# Patient Record
Sex: Female | Born: 1960 | Race: White | Hispanic: No | Marital: Married
Health system: Southern US, Community
[De-identification: ages and names within clinical notes are randomized; demographics above are authoritative.]

## PROBLEM LIST (undated history)

## (undated) DIAGNOSIS — C801 Malignant (primary) neoplasm, unspecified: Secondary | ICD-10-CM

## (undated) DIAGNOSIS — B977 Papillomavirus as the cause of diseases classified elsewhere: Secondary | ICD-10-CM

## (undated) HISTORY — DX: Papillomavirus as the cause of diseases classified elsewhere: B97.7

---

## 2014-12-08 ENCOUNTER — Other Ambulatory Visit: Payer: Self-pay | Admitting: Physician Assistant

## 2014-12-08 DIAGNOSIS — M5126 Other intervertebral disc displacement, lumbar region: Secondary | ICD-10-CM | POA: Insufficient documentation

## 2014-12-08 DIAGNOSIS — Z1231 Encounter for screening mammogram for malignant neoplasm of breast: Secondary | ICD-10-CM

## 2014-12-24 ENCOUNTER — Ambulatory Visit
Admission: RE | Admit: 2014-12-24 | Discharge: 2014-12-24 | Disposition: A | Discharge status: Home / Self Care | Payer: Managed Care, Other (non HMO) | Source: Ambulatory Visit | Attending: Physician Assistant | Admitting: Physician Assistant

## 2014-12-24 DIAGNOSIS — Z1231 Encounter for screening mammogram for malignant neoplasm of breast: Secondary | ICD-10-CM | POA: Insufficient documentation

## 2014-12-24 HISTORY — DX: Malignant (primary) neoplasm, unspecified: C80.1

## 2015-03-28 DIAGNOSIS — J302 Other seasonal allergic rhinitis: Secondary | ICD-10-CM | POA: Insufficient documentation

## 2015-11-16 ENCOUNTER — Other Ambulatory Visit: Payer: Self-pay | Admitting: Physician Assistant

## 2015-12-15 ENCOUNTER — Other Ambulatory Visit: Payer: Self-pay | Admitting: Physician Assistant

## 2015-12-15 DIAGNOSIS — Z1231 Encounter for screening mammogram for malignant neoplasm of breast: Secondary | ICD-10-CM

## 2016-01-04 ENCOUNTER — Other Ambulatory Visit: Payer: Self-pay | Admitting: Physician Assistant

## 2016-01-04 ENCOUNTER — Ambulatory Visit
Admission: RE | Admit: 2016-01-04 | Discharge: 2016-01-04 | Disposition: A | Discharge status: Home / Self Care | Payer: Managed Care, Other (non HMO) | Source: Ambulatory Visit | Attending: Physician Assistant | Admitting: Physician Assistant

## 2016-01-04 DIAGNOSIS — Z1231 Encounter for screening mammogram for malignant neoplasm of breast: Secondary | ICD-10-CM | POA: Insufficient documentation

## 2016-11-22 ENCOUNTER — Other Ambulatory Visit: Payer: Self-pay | Admitting: Physician Assistant

## 2016-11-22 DIAGNOSIS — Z1231 Encounter for screening mammogram for malignant neoplasm of breast: Secondary | ICD-10-CM

## 2017-01-07 ENCOUNTER — Ambulatory Visit
Admission: RE | Admit: 2017-01-07 | Discharge: 2017-01-07 | Disposition: A | Discharge status: Home / Self Care | Payer: Managed Care, Other (non HMO) | Source: Ambulatory Visit | Attending: Physician Assistant | Admitting: Physician Assistant

## 2017-01-07 DIAGNOSIS — Z1231 Encounter for screening mammogram for malignant neoplasm of breast: Secondary | ICD-10-CM | POA: Insufficient documentation

## 2018-02-06 ENCOUNTER — Other Ambulatory Visit: Payer: Self-pay | Admitting: Physician Assistant

## 2018-02-06 DIAGNOSIS — Z1231 Encounter for screening mammogram for malignant neoplasm of breast: Secondary | ICD-10-CM

## 2018-02-21 ENCOUNTER — Encounter: Payer: Self-pay | Admitting: Radiology

## 2018-02-21 ENCOUNTER — Ambulatory Visit
Admission: RE | Admit: 2018-02-21 | Discharge: 2018-02-21 | Disposition: A | Discharge status: Home / Self Care | Payer: 59 | Source: Ambulatory Visit | Attending: Physician Assistant | Admitting: Physician Assistant

## 2018-02-21 DIAGNOSIS — Z1231 Encounter for screening mammogram for malignant neoplasm of breast: Secondary | ICD-10-CM | POA: Diagnosis present

## 2018-03-03 ENCOUNTER — Other Ambulatory Visit: Payer: Self-pay | Admitting: Physician Assistant

## 2018-03-03 DIAGNOSIS — R928 Other abnormal and inconclusive findings on diagnostic imaging of breast: Secondary | ICD-10-CM

## 2018-03-07 ENCOUNTER — Ambulatory Visit
Admission: RE | Admit: 2018-03-07 | Discharge: 2018-03-07 | Disposition: A | Discharge status: Home / Self Care | Payer: 59 | Source: Ambulatory Visit | Attending: Physician Assistant | Admitting: Physician Assistant

## 2018-03-07 ENCOUNTER — Emergency Department
Admission: EM | Admit: 2018-03-07 | Discharge: 2018-03-07 | Disposition: A | Discharge status: Home / Self Care | Payer: 59 | Attending: Emergency Medicine | Admitting: Emergency Medicine

## 2018-03-07 ENCOUNTER — Other Ambulatory Visit: Payer: Self-pay

## 2018-03-07 DIAGNOSIS — R928 Other abnormal and inconclusive findings on diagnostic imaging of breast: Secondary | ICD-10-CM | POA: Insufficient documentation

## 2018-03-07 DIAGNOSIS — R0602 Shortness of breath: Secondary | ICD-10-CM | POA: Diagnosis present

## 2018-03-07 DIAGNOSIS — T782XXA Anaphylactic shock, unspecified, initial encounter: Secondary | ICD-10-CM | POA: Insufficient documentation

## 2018-03-07 MED ORDER — PREDNISONE 50 MG PO TABS: 50.0000 mg | ORAL_TABLET | Freq: Every day | ORAL | 0 refills | Status: AC

## 2018-03-07 MED ORDER — PREDNISONE 20 MG PO TABS
60.0000 mg | ORAL_TABLET | Freq: Once | ORAL | Status: AC
  Administered 2018-03-07: 60 mg via ORAL
  Filled 2018-03-07: qty 3

## 2018-03-07 MED ORDER — CETIRIZINE HCL 10 MG PO TABS: 10.0000 mg | ORAL_TABLET | Freq: Four times a day (QID) | ORAL | 0 refills | Status: DC

## 2018-03-07 MED ORDER — EPINEPHRINE 0.3 MG/0.3ML IJ SOAJ: 0.3000 mg | Freq: Once | INTRAMUSCULAR | 0 refills | Status: AC

## 2018-03-07 MED ORDER — EPINEPHRINE 0.3 MG/0.3ML IJ SOAJ
0.3000 mg | Freq: Once | INTRAMUSCULAR | Status: AC
  Administered 2018-03-07: 0.3 mg via INTRAMUSCULAR
  Filled 2018-03-07: qty 0.3

## 2018-03-07 NOTE — Discharge Instructions (Signed)
Please stop taking moxifloxacin - the next time you have an allergic reaction it will likely be much worse.  Take all of your steroids as prescribed and return to the ED for any concerns.  It was a pleasure to take care of you today, and thank you for coming to our emergency department.  If you have any questions or concerns before leaving please ask the nurse to grab me and I'm more than happy to go through your aftercare instructions again.  If you were prescribed any opioid pain medication today such as Norco, Vicodin, Percocet, morphine, hydrocodone, or oxycodone please make sure you do not drive when you are taking this medication as it can alter your ability to drive safely.  If you have any concerns once you are home that you are not improving or are in fact getting worse before you can make it to your follow-up appointment, please do not hesitate to call 911 and come back for further evaluation.  Darel Hong, MD  No results found for this or any previous visit. Mm 3d Screen Breast Bilateral  Result Date: 02/25/2018 CLINICAL DATA:  Screening. EXAM: DIGITAL SCREENING BILATERAL MAMMOGRAM WITH TOMO AND CAD COMPARISON:  Previous exam(s). ACR Breast Density Category c: The breast tissue is heterogeneously dense, which may obscure small masses. FINDINGS: In the left breast, possible distortion warrants further evaluation. In the right breast, no findings suspicious for malignancy. Images were processed with CAD. IMPRESSION: Further evaluation is suggested for possible distortion in the left breast. RECOMMENDATION: Diagnostic mammogram and possibly ultrasound of the left breast. (Code:[id number]) The patient will be contacted regarding the findings, and additional imaging will be scheduled. BI-RADS CATEGORY  0: Incomplete. Need additional imaging evaluation and/or prior mammograms for comparison. Electronically Signed   By: Evangeline Dakin M.D.   On: 02/25/2018 12:43

## 2018-03-07 NOTE — ED Notes (Signed)
E-sign is not working.Pt states verbal understanding of discharge instructions.

## 2018-03-07 NOTE — ED Triage Notes (Addendum)
Pt was started yesterday on antibiotics for sinus infection, this am woke up with tingling all over, and tongue swelling. Pt took benadryl 25 mg about 0100.

## 2018-03-07 NOTE — ED Provider Notes (Signed)
[facility] Emergency Department Provider Note  ____________________________________________   First MD Initiated Contact with Patient 03/07/18 0116     (approximate)  I have reviewed the triage vital signs and the nursing notes.   HISTORY  Chief Complaint Allergic Reaction   HPI Natasha Richardson is a 57 y.o. female who comes to the emergency department with tongue swelling, "shortness of breath" and diffuse itching and tingling shortly after taking her second lifetime dose of  moxifloxacin for a sinus infection.  She took her first dose yesterday without issues and then about 30 minutes after taking her second dose today her symptoms began.  She does have known allergies to sulfa antibiotics.  She took 25 mg of Benadryl with minimal relief.  Symptoms came on suddenly have been progressive are now moderate severity and nothing seems to make them significantly better.   Past Medical History:  Diagnosis Date  . Cancer ([place])    skin    There are no active problems to display for this patient.   No past surgical history on file.  Prior to Admission medications   Medication Sig Start Date End Date Taking? Authorizing Provider  cetirizine (ZYRTEC ALLERGY) 10 MG tablet Take 1 tablet (10 mg total) by mouth 4 (four) times daily for 7 days. 03/07/18 03/14/18  Darel Hong, MD  predniSONE (DELTASONE) 50 MG tablet Take 1 tablet (50 mg total) by mouth daily for 4 days. 03/07/18 03/11/18  Darel Hong, MD    Allergies Sulfa antibiotics  Family History  Problem Relation Age of Onset  . Breast cancer Paternal Aunt 45    Social History Social History   Tobacco Use  . Smoking status: Not on file  Substance Use Topics  . Alcohol use: Not on file  . Drug use: Not on file    Review of Systems Constitutional: No fever/chills Eyes: No visual changes. ENT: Positive for difficulty swelling and tongue swelling Cardiovascular: Denies chest pain. Respiratory:  Positive for shortness of breath. Gastrointestinal: No abdominal pain.  No nausea, no vomiting.  No diarrhea.  No constipation. Genitourinary: Negative for dysuria. Musculoskeletal: Negative for back pain. Skin: Positive for itching and rash Neurological: Negative for headaches, focal weakness or numbness.   ____________________________________________   PHYSICAL EXAM:  VITAL SIGNS: ED Triage Vitals  Enc Vitals Group     BP 03/07/18 0113 128/79     Pulse Rate 03/07/18 0113 71     Resp 03/07/18 0113 20     Temp 03/07/18 0113 98.2 F (36.8 C)     Temp Source 03/07/18 0113 Oral     SpO2 03/07/18 0113 100 %     Weight 03/07/18 0114 180 lb (81.6 kg)     Height 03/07/18 0114 5\' 4"  (1.626 m)     Head Circumference --      Peak Flow --      Pain Score --      Pain Loc --      Pain Edu? --      Excl. in [place]? --     Constitutional: Alert and oriented x4 appears somewhat uncomfortable nontoxic no diaphoresis Eyes: PERRL EOMI. Head: Atraumatic. Nose: No congestion/rhinnorhea. Mouth/Throat: No trismus tongue is slightly swollen although protecting airway no drooling Neck: No stridor.   Cardiovascular: Normal rate, regular rhythm. Grossly normal heart sounds.  Good peripheral circulation. Respiratory: Normal respiratory effort.  No retractions. Lungs CTAB and moving good air Gastrointestinal: Soft nontender Musculoskeletal: No lower extremity edema   Neurologic:  Normal speech and language. No gross focal neurologic deficits are appreciated. Skin: Mild erythematous rash across upper chest Psychiatric: Mood and affect are normal. Speech and behavior are normal.    ____________________________________________   DIFFERENTIAL includes but not limited to  Allergic reaction, anaphylaxis, drug reaction ____________________________________________   LABS (all labs ordered are listed, but only abnormal results are displayed)  Labs Reviewed - No data to  display   __________________________________________  EKG   ____________________________________________  RADIOLOGY   ____________________________________________   PROCEDURES  Procedure(s) performed: no  .Critical Care Performed by: Darel Hong, MD Authorized by: Darel Hong, MD   Critical care provider statement:    Critical care time (minutes):  30   Critical care time was exclusive of:  Separately billable procedures and treating other patients   Critical care was necessary to treat or prevent imminent or life-threatening deterioration of the following conditions: Anaphylaxis.   Critical care was time spent personally by me on the following activities:  Development of treatment plan with patient or surrogate, discussions with consultants, evaluation of patient's response to treatment, examination of patient, obtaining history from patient or surrogate, ordering and performing treatments and interventions, ordering and review of laboratory studies, ordering and review of radiographic studies, pulse oximetry, re-evaluation of patient's condition and review of old charts    Critical Care performed: no  ____________________________________________   INITIAL IMPRESSION / [place] / ED COURSE  Pertinent labs & imaging results that were available during my care of the patient were reviewed by me and considered in my medical decision making (see chart for details).   As part of my medical decision making, I reviewed the following data within the [place] History obtained from family if available, nursing notes, old chart and ekg, as well as notes from prior ED visits.  The patient symptoms are consistent with allergic reaction and mild anaphylaxis.  We will give the patient IM epinephrine in addition to steroids and reevaluate.  Following intramuscular epinephrine the patient's symptoms rapidly improved leading me to believe this is  likely allergic reaction and mild anaphylaxis.  She was observed for hours in the emergency department with no rebound.  I will discharge her home with a short course of steroids as well as high-dose cetirizine and an EpiPen for home.  She understands not to take quinolones in the future.      ____________________________________________   FINAL CLINICAL IMPRESSION(S) / ED DIAGNOSES  Final diagnoses:  Anaphylaxis, initial encounter      NEW MEDICATIONS STARTED DURING THIS VISIT:  Discharge Medication List as of 03/07/2018  4:08 AM    START taking these medications   Details  cetirizine (ZYRTEC ALLERGY) 10 MG tablet Take 1 tablet (10 mg total) by mouth 4 (four) times daily for 7 days., Starting Fri 03/07/2018, Until Fri 03/14/2018, Print    EPINEPHrine (EPIPEN 2-PAK) 0.3 mg/0.3 mL IJ SOAJ injection Inject 0.3 mLs (0.3 mg total) into the muscle once for 1 dose., Starting Fri 03/07/2018, Print    predniSONE (DELTASONE) 50 MG tablet Take 1 tablet (50 mg total) by mouth daily for 4 days., Starting Fri 03/07/2018, Until Tue 03/11/2018, Print         Note:  This document was prepared using Dragon voice recognition software and may include unintentional dictation errors.     Darel Hong, MD 03/09/18 [phone]

## 2019-03-12 ENCOUNTER — Other Ambulatory Visit: Payer: Self-pay | Admitting: Physician Assistant

## 2019-03-12 DIAGNOSIS — Z1231 Encounter for screening mammogram for malignant neoplasm of breast: Secondary | ICD-10-CM

## 2019-04-20 ENCOUNTER — Ambulatory Visit
Admission: RE | Admit: 2019-04-20 | Discharge: 2019-04-20 | Disposition: A | Discharge status: Home / Self Care | Payer: BC Managed Care – PPO | Source: Ambulatory Visit | Attending: Physician Assistant | Admitting: Physician Assistant

## 2019-04-20 DIAGNOSIS — Z1231 Encounter for screening mammogram for malignant neoplasm of breast: Secondary | ICD-10-CM | POA: Insufficient documentation

## 2020-03-28 ENCOUNTER — Other Ambulatory Visit: Payer: Self-pay | Admitting: Physician Assistant

## 2020-03-28 DIAGNOSIS — Z1231 Encounter for screening mammogram for malignant neoplasm of breast: Secondary | ICD-10-CM

## 2020-05-31 ENCOUNTER — Ambulatory Visit
Admission: RE | Admit: 2020-05-31 | Discharge: 2020-05-31 | Disposition: A | Discharge status: Home / Self Care | Payer: No Typology Code available for payment source | Source: Ambulatory Visit | Attending: Physician Assistant | Admitting: Physician Assistant

## 2020-05-31 ENCOUNTER — Other Ambulatory Visit: Payer: Self-pay

## 2020-05-31 DIAGNOSIS — Z1231 Encounter for screening mammogram for malignant neoplasm of breast: Secondary | ICD-10-CM | POA: Insufficient documentation

## 2020-12-06 ENCOUNTER — Other Ambulatory Visit: Payer: Self-pay | Admitting: Family Medicine

## 2020-12-06 DIAGNOSIS — N644 Mastodynia: Secondary | ICD-10-CM

## 2020-12-08 ENCOUNTER — Other Ambulatory Visit: Payer: Self-pay | Admitting: Family Medicine

## 2020-12-08 DIAGNOSIS — N644 Mastodynia: Secondary | ICD-10-CM

## 2020-12-12 ENCOUNTER — Other Ambulatory Visit: Payer: No Typology Code available for payment source

## 2020-12-12 ENCOUNTER — Ambulatory Visit
Admission: RE | Admit: 2020-12-12 | Discharge: 2020-12-12 | Disposition: A | Discharge status: Home / Self Care | Payer: No Typology Code available for payment source | Source: Ambulatory Visit | Attending: Family Medicine | Admitting: Family Medicine

## 2020-12-12 ENCOUNTER — Other Ambulatory Visit: Payer: Self-pay

## 2020-12-12 DIAGNOSIS — N644 Mastodynia: Secondary | ICD-10-CM | POA: Insufficient documentation

## 2021-04-07 ENCOUNTER — Other Ambulatory Visit: Payer: Self-pay | Admitting: Physician Assistant

## 2021-04-07 DIAGNOSIS — Z1231 Encounter for screening mammogram for malignant neoplasm of breast: Secondary | ICD-10-CM

## 2021-06-01 ENCOUNTER — Ambulatory Visit
Admission: RE | Admit: 2021-06-01 | Discharge: 2021-06-01 | Disposition: A | Discharge status: Home / Self Care | Payer: No Typology Code available for payment source | Source: Ambulatory Visit | Attending: Physician Assistant | Admitting: Physician Assistant

## 2021-06-01 ENCOUNTER — Other Ambulatory Visit: Payer: Self-pay

## 2021-06-01 DIAGNOSIS — Z1231 Encounter for screening mammogram for malignant neoplasm of breast: Secondary | ICD-10-CM | POA: Diagnosis not present

## 2021-11-09 ENCOUNTER — Other Ambulatory Visit: Payer: Self-pay

## 2021-11-09 DIAGNOSIS — N390 Urinary tract infection, site not specified: Secondary | ICD-10-CM

## 2021-11-10 ENCOUNTER — Other Ambulatory Visit
Admission: RE | Admit: 2021-11-10 | Discharge: 2021-11-10 | Disposition: A | Discharge status: Home / Self Care | Payer: No Typology Code available for payment source | Attending: Urology | Admitting: Urology

## 2021-11-10 ENCOUNTER — Encounter: Payer: Self-pay | Admitting: Urology

## 2021-11-10 ENCOUNTER — Ambulatory Visit (INDEPENDENT_AMBULATORY_CARE_PROVIDER_SITE_OTHER): Payer: No Typology Code available for payment source | Admitting: Urology

## 2021-11-10 VITALS — BP 131/81 | HR 75 | Ht 64.0 in | Wt 185.0 lb

## 2021-11-10 DIAGNOSIS — R102 Pelvic and perineal pain: Secondary | ICD-10-CM

## 2021-11-10 DIAGNOSIS — R350 Frequency of micturition: Secondary | ICD-10-CM | POA: Diagnosis not present

## 2021-11-10 DIAGNOSIS — R3129 Other microscopic hematuria: Secondary | ICD-10-CM

## 2021-11-10 DIAGNOSIS — N390 Urinary tract infection, site not specified: Secondary | ICD-10-CM | POA: Diagnosis present

## 2021-11-10 DIAGNOSIS — N950 Postmenopausal bleeding: Secondary | ICD-10-CM | POA: Insufficient documentation

## 2021-11-10 LAB — URINALYSIS, COMPLETE (UACMP) WITH MICROSCOPIC
Bilirubin Urine: NEGATIVE
Glucose, UA: NEGATIVE mg/dL
Ketones, ur: NEGATIVE mg/dL
Leukocytes,Ua: NEGATIVE
Nitrite: NEGATIVE
Protein, ur: NEGATIVE mg/dL
Specific Gravity, Urine: 1.02 (ref 1.005–1.030)
pH: 5 (ref 5.0–8.0)

## 2021-11-10 NOTE — Patient Instructions (Addendum)
[email or web address]   Cystoscopy Cystoscopy is a procedure that is used to help diagnose and sometimes treat conditions that affect the lower urinary tract. The lower urinary tract includes the bladder and the urethra. The urethra is the tube that drains urine from the bladder. Cystoscopy is done using a thin, tube-shaped instrument with a light and camera at the end (cystoscope). The cystoscope may be hard or flexible, depending on the goal of the procedure. The cystoscope is inserted through the urethra, into the bladder. Cystoscopy may be recommended if you have: Urinary tract infections that keep coming back. Blood in the urine (hematuria). An inability to control when you urinate (urinary incontinence) or an overactive bladder. Unusual cells found in a urine sample. A blockage in the urethra, such as a urinary stone. Painful urination. An abnormality in the bladder found during an intravenous pyelogram (IVP) or CT scan. What are the risks? Generally, this is a safe procedure. However, problems may occur, including: Infection. Bleeding.  What happens during the procedure?  You will be given one or more of the following: A medicine to numb the area (local anesthetic). The area around the opening of your urethra will be cleaned. The cystoscope will be passed through your urethra into your bladder. Germ-free (sterile) fluid will flow through the cystoscope to fill your bladder. The fluid will stretch your bladder so that your health care provider can clearly examine your bladder walls. Your doctor will look at the urethra and bladder. The cystoscope will be removed The procedure may vary among health care providers  What can I expect after the procedure? After the procedure, it is common to have: Some soreness or pain in your urethra. Urinary symptoms. These include: Mild pain or burning when you urinate. Pain should stop within a few minutes after you urinate. This may last for up to a  few days after the procedure. A small amount of blood in your urine for several days. Feeling like you need to urinate but producing only a small amount of urine. Follow these instructions at home: General instructions Return to your normal activities as told by your health care provider.  Drink plenty of fluids after the procedure. Keep all follow-up visits as told by your health care provider. This is important. Contact a health care provider if you: Have pain that gets worse or does not get better with medicine, especially pain when you urinate lasting longer than 72 hours after the procedure. Have trouble urinating. Get help right away if you: Have blood clots in your urine. Have a fever or chills. Are unable to urinate. Summary Cystoscopy is a procedure that is used to help diagnose and sometimes treat conditions that affect the lower urinary tract. Cystoscopy is done using a thin, tube-shaped instrument with a light and camera at the end. After the procedure, it is common to have some soreness or pain in your urethra. It is normal to have blood in your urine after the procedure.  If you were prescribed an antibiotic medicine, take it as told by your health care provider.  This information is not intended to replace advice given to you by your health care provider. Make sure you discuss any questions you have with your health care provider. Document Revised: 06/03/2018 Document Reviewed: 06/03/2018 Elsevier Patient Education  [place].

## 2021-11-10 NOTE — Progress Notes (Signed)
11/10/21 5:29 PM   Christene Slates [birth year] [id number]  Referring provider:  Marinda Elk, MD [facility] [place],  [place] [address] Chief Complaint  Patient presents with   Recurrent UTI     HPI: Natasha Richardson is a 61 y.o.female who presents today for further evaluation of rUTIs.   She had a urinalysis on 11/06/2021 that showed trace blood, trace leukocytes, and rare bacteria.  Associated urine culture did show 100,000 colonies of E. coli despite her essentially negative urinalysis.  Her symptoms resolved spontaneously and she has not been treated for this.  She had multiple occasions where she has very bland appearing urinalysis and occasionally will grow E. coli.  Her primary symptoms are urinary frequency and some pelvic discomfort/pressure.  She denies any overt dysuria or gross hematuria.  Originally, she felt that her symptoms may be GYN in origin but has since ruled this out.  She also has a personal history of IBS, fevers loose stool rather than constipation.  Symptoms sometimes are triggered by stress.  She is not aware of any dietary triggers.  She is post menopause.  She is starting progestin for intermittent bleeding.   PMH: Past Medical History:  Diagnosis Date   Cancer ([place])    skin   HPV in female     Surgical History: History reviewed. No pertinent surgical history.  Home Medications:  Allergies as of 11/10/2021       Reactions   Moxifloxacin Anaphylaxis   Sulfa Antibiotics Rash        Medication List        Accurate as of Nov 10, 2021 11:59 PM. If you have any questions, ask your nurse or doctor.          STOP taking these medications    cetirizine 10 MG tablet Commonly known as: ZyrTEC Allergy Stopped by: Hollice Espy, MD   naproxen 500 MG tablet Commonly known as: NAPROSYN Stopped by: Hollice Espy, MD   progesterone 100 MG capsule Commonly known as: PROMETRIUM Stopped by: Hollice Espy,  MD   Sodium Fluoride 5000 PPM 1.1 % Pste Generic drug: Sodium Fluoride Stopped by: Hollice Espy, MD        Allergies:  Allergies  Allergen Reactions   Moxifloxacin Anaphylaxis   Sulfa Antibiotics Rash    Family History: Family History  Problem Relation Age of Onset   Parkinson's disease Mother    Leukemia Father    Breast cancer Paternal Aunt 58    Social History:  has no history on file for tobacco use, alcohol use, and drug use.   Physical Exam: BP 131/81   Pulse 75   Ht '5\' 4"'$  (1.626 m)   Wt 185 lb (83.9 kg)   LMP 12/30/2015   BMI 31.76 kg/m   Constitutional:  Alert and oriented, No acute distress. HEENT:  AT, moist mucus membranes.  Trachea midline, no masses. Cardiovascular: No clubbing, cyanosis, or edema. Respiratory: Normal respiratory effort, no increased work of breathing. Skin: No rashes, bruises or suspicious lesions. Neurologic: Grossly intact, no focal deficits, moving all 4 extremities. Psychiatric: Normal mood and affect.  Laboratory Data: Urinalysis Component     Latest Ref Rng 11/10/2021  Color, Urine     YELLOW  YELLOW   Appearance     CLEAR  CLEAR   Specific Gravity, Urine     1.005 - 1.030  1.020   pH     5.0 - 8.0  5.0   Glucose,  UA     NEGATIVE mg/dL NEGATIVE   Hgb urine dipstick     NEGATIVE  TRACE !   Bilirubin Urine     NEGATIVE  NEGATIVE   Ketones, ur     NEGATIVE mg/dL NEGATIVE   Protein     NEGATIVE mg/dL NEGATIVE   Nitrite     NEGATIVE  NEGATIVE   Leukocytes,Ua     NEGATIVE  NEGATIVE   Squamous Epithelial / LPF     0 - 5  0-5   WBC, UA     0 - 5 WBC/hpf 0-5   RBC / HPF     0 - 5 RBC/hpf 6-10   Bacteria, UA     NONE SEEN  FEW !     ! Abnormal   Assessment & Plan:    Microscopic hematuria  - We discussed the differential diagnosis for microscopic hematuria including nephrolithiasis, renal or upper tract tumors, bladder stones, UTIs, or bladder tumors as well as undetermined etiologies. -Risk factors  indicate this is low risk hematuria - Per AUA guidelines, I did recommend complete microscopic hematuria evaluation including renal ultrasound, possible urine cytology, and office cystoscopy.  2. Pelvic pressure -Based on her very bland appearing urinalyses, is unclear whether or not these are true infections.  This may also be chronic bacterial colonization versus specimen contamination.  -Differential diagnosis for her intermittent symptoms also includes IC.  I recommended further evaluation of #1, consideration of catheterized urine sample when she is having symptoms for UA urine culture to help differentiate 8 between contamination versus colonization versus true bacterial infection.  She is asymptomatic today.  -We discussed the diease pathophysiology is poorly understood therefore treatment has been focused primarily on symptomatic relief as well as dietary and behavioral modification. Information pamphlets were reviewed and given today discussing the current understanding of the syndrome as well as treatment options. IC dietary information also given today as many patients experience relief with simple lifestyle modifications. We also discussed intermittent use of over the counter pyridium (no greater than 3 days at at time) for intermittent relief.  Follow-up cystoscopy/ RUS  Conley Rolls as a scribe for Hollice Espy, MD.,have documented all relevant documentation on the behalf of Hollice Espy, MD,as directed by  Hollice Espy, MD while in the presence of Hollice Espy, MD.  I have reviewed the above documentation for accuracy and completeness, and I agree with the above.   Hollice Espy, MD   [facility] [address], [place], [place] [address] [phone] b

## 2021-11-24 ENCOUNTER — Ambulatory Visit
Admission: RE | Admit: 2021-11-24 | Discharge: 2021-11-24 | Disposition: A | Discharge status: Home / Self Care | Payer: No Typology Code available for payment source | Source: Ambulatory Visit | Attending: Urology | Admitting: Urology

## 2021-11-24 DIAGNOSIS — R3129 Other microscopic hematuria: Secondary | ICD-10-CM | POA: Insufficient documentation

## 2021-11-29 ENCOUNTER — Other Ambulatory Visit: Payer: Self-pay | Admitting: Physician Assistant

## 2021-11-29 DIAGNOSIS — M5416 Radiculopathy, lumbar region: Secondary | ICD-10-CM

## 2021-12-11 ENCOUNTER — Ambulatory Visit
Admission: RE | Admit: 2021-12-11 | Discharge: 2021-12-11 | Disposition: A | Discharge status: Home / Self Care | Payer: No Typology Code available for payment source | Source: Ambulatory Visit | Attending: Physician Assistant | Admitting: Physician Assistant

## 2021-12-11 DIAGNOSIS — M5416 Radiculopathy, lumbar region: Secondary | ICD-10-CM | POA: Insufficient documentation

## 2021-12-12 ENCOUNTER — Encounter: Payer: Self-pay | Admitting: Urology

## 2021-12-12 ENCOUNTER — Ambulatory Visit (INDEPENDENT_AMBULATORY_CARE_PROVIDER_SITE_OTHER): Payer: No Typology Code available for payment source | Admitting: Urology

## 2021-12-12 VITALS — BP 121/74 | HR 66 | Ht 64.0 in | Wt 185.0 lb

## 2021-12-12 DIAGNOSIS — R3129 Other microscopic hematuria: Secondary | ICD-10-CM

## 2021-12-12 DIAGNOSIS — N390 Urinary tract infection, site not specified: Secondary | ICD-10-CM

## 2021-12-12 LAB — MICROSCOPIC EXAMINATION

## 2021-12-12 LAB — URINALYSIS, COMPLETE
Bilirubin, UA: NEGATIVE
Glucose, UA: NEGATIVE
Ketones, UA: NEGATIVE
Nitrite, UA: NEGATIVE
Specific Gravity, UA: 1.03 (ref 1.005–1.030)
Urobilinogen, Ur: 0.2 mg/dL (ref 0.2–1.0)
pH, UA: 5 (ref 5.0–7.5)

## 2021-12-12 MED ORDER — CEPHALEXIN 250 MG PO CAPS
500.0000 mg | ORAL_CAPSULE | Freq: Once | ORAL | Status: AC
  Administered 2021-12-12: 500 mg via ORAL

## 2021-12-12 NOTE — Patient Instructions (Signed)
Cranberry tablets as directed. Probiotics as directed. D-Mannose as directed

## 2021-12-12 NOTE — Progress Notes (Signed)
   12/13/21  CC:  Chief Complaint  Patient presents with   Cysto     HPI: Natasha Richardson is a 61 y.o.female  with a personal history of microscopic hematuria and pelvic pressure who presents today for a diagnostic cystoscopy.   She had a urinalysis on 11/06/2021 that showed trace blood, trace leukocytes, and rare bacteria.  Associated urine culture did show 100,000 colonies of E. coli despite her essentially negative urinalysis.   She had multiple occasions where she has very bland appearing urinalysis and occasionally will grow E. coli.  RUS on 11/24/2021 visualized no significant abnormalities.  No cause for hematuria identified.  She is asymptomatic today other than some pelvic pressure.  No overt dysuria.   Vitals:   12/12/21 1501  BP: 121/74  Pulse: 66   NED. A&Ox3.   No respiratory distress   Abd soft, NT, ND Normal external genitalia with patent urethral meatus  Cystoscopy Procedure Note  Patient identification was confirmed, informed consent was obtained, and patient was prepped using Betadine solution.  Lidocaine jelly was administered per urethral meatus.    Procedure: - Flexible cystoscope introduced, without any difficulty.   - Thorough search of the bladder revealed:    normal urethral meatus    normal urothelium    no stones    no ulcers     no tumors    no urethral polyps    no trabeculation    Mild trigonitis   - Ureteral orifices were normal in position and appearance.  Post-Procedure: - Patient tolerated the procedure well  Assessment/ Plan:  Chronic cystitis bacteruria vs IC  -Cystoscopy today is fairly unremarkable, reassuring - Recommend conservative management  -  Counseled her in UTI prevention supplements such as cranberry tablets, probiotics, yogurt that has active lactobacillus culture, and d-mannose  - will hold off on topical estrogen cream as these do not seem to be true infections  - She will return if she feels symptomatic  -She  was given information today on IC diet, reports today her symptoms seem to be exacerbated by citrus - She was given a dose of antibiotics    Conley Rolls as a scribe for Hollice Espy, MD.,have documented all relevant documentation on the behalf of Hollice Espy, MD,as directed by  Hollice Espy, MD while in the presence of Hollice Espy, MD.  I have reviewed the above documentation for accuracy and completeness, and I agree with the above.   Hollice Espy, MD

## 2021-12-15 ENCOUNTER — Telehealth: Payer: Self-pay

## 2021-12-15 LAB — CULTURE, URINE COMPREHENSIVE

## 2022-03-28 ENCOUNTER — Other Ambulatory Visit: Payer: Self-pay | Admitting: Physician Assistant

## 2022-03-28 DIAGNOSIS — Z1231 Encounter for screening mammogram for malignant neoplasm of breast: Secondary | ICD-10-CM

## 2022-05-03 ENCOUNTER — Ambulatory Visit (INDEPENDENT_AMBULATORY_CARE_PROVIDER_SITE_OTHER): Payer: No Typology Code available for payment source | Admitting: Physician Assistant

## 2022-05-03 VITALS — BP 124/79 | HR 73 | Ht 64.0 in | Wt 187.0 lb

## 2022-05-03 DIAGNOSIS — N390 Urinary tract infection, site not specified: Secondary | ICD-10-CM

## 2022-05-03 LAB — MICROSCOPIC EXAMINATION

## 2022-05-03 LAB — URINALYSIS, COMPLETE
Bilirubin, UA: NEGATIVE
Glucose, UA: NEGATIVE
Ketones, UA: NEGATIVE
Nitrite, UA: POSITIVE — AB
Protein,UA: NEGATIVE
Specific Gravity, UA: 1.025 (ref 1.005–1.030)
Urobilinogen, Ur: 0.2 mg/dL (ref 0.2–1.0)
pH, UA: 5 (ref 5.0–7.5)

## 2022-05-03 MED ORDER — NITROFURANTOIN MONOHYD MACRO 100 MG PO CAPS: 100.0000 mg | ORAL_CAPSULE | Freq: Two times a day (BID) | ORAL | 0 refills | Status: AC

## 2022-05-03 NOTE — Progress Notes (Signed)
05/03/2022 2:48 PM   Natasha Richardson 26-Dec-1960 [id number]  CC: Chief Complaint  Patient presents with   Urinary Tract Infection   HPI: Natasha Richardson is a 61 y.o. female with PMH urinary frequency and pelvic pressure as well as microscopic hematuria with benign work-up in June 2023 who presents today for evaluation of possible UTI.   Today she reports 2 days of lower abdominal pain and gross hematuria.  Her symptoms have started to improve today.  She reports some bothersome pressure after termination.  In-office UA today positive for trace intact blood, nitrites, and trace leukocytes; urine microscopy with 11-30 WBCs/HPF, 3-10 RBCs/HPF, and many bacteria.   PMH: Past Medical History:  Diagnosis Date   Cancer ([place])    skin   HPV in female     Surgical History: No past surgical history on file.  Home Medications:  Allergies as of 05/03/2022       Reactions   Moxifloxacin Anaphylaxis   Sulfa Antibiotics Rash        Medication List        Accurate as of May 03, 2022  2:48 PM. If you have any questions, ask your nurse or doctor.          naproxen 500 MG tablet Commonly known as: NAPROSYN Take 500 mg by mouth 2 (two) times daily with a meal. PRN   nitrofurantoin (macrocrystal-monohydrate) 100 MG capsule Commonly known as: MACROBID Take 1 capsule (100 mg total) by mouth 2 (two) times daily for 5 days.        Allergies:  Allergies  Allergen Reactions   Moxifloxacin Anaphylaxis   Sulfa Antibiotics Rash    Family History: Family History  Problem Relation Age of Onset   Parkinson's disease Mother    Leukemia Father    Breast cancer Paternal Aunt 44    Social History:   has no history on file for tobacco use, alcohol use, and drug use.  Physical Exam: BP 124/79   Pulse 73   Ht '5\' 4"'$  (1.626 m)   Wt 187 lb (84.8 kg)   LMP 12/30/2015   BMI 32.10 kg/m   Constitutional:  Alert and oriented, no acute distress, nontoxic appearing HEENT: [place],  AT Cardiovascular: No clubbing, cyanosis, or edema Respiratory: Normal respiratory effort, no increased work of breathing Skin: No rashes, bruises or suspicious lesions Neurologic: Grossly intact, no focal deficits, moving all 4 extremities Psychiatric: Normal mood and affect  Laboratory Data: Results for orders placed or performed in visit on 05/03/22  Microscopic Examination   Urine  Result Value Ref Range   WBC, UA 11-30 (A) 0 - 5 /hpf   RBC, Urine 3-10 (A) 0 - 2 /hpf   Epithelial Cells (non renal) 0-10 0 - 10 /hpf   Bacteria, UA Many (A) None seen/Few  Urinalysis, Complete  Result Value Ref Range   Specific Gravity, UA 1.025 1.005 - 1.030   pH, UA 5.0 5.0 - 7.5   Color, UA Yellow Yellow   Appearance Ur Hazy (A) Clear   Leukocytes,UA Trace (A) Negative   Protein,UA Negative Negative/Trace   Glucose, UA Negative Negative   Ketones, UA Negative Negative   RBC, UA Trace (A) Negative   Bilirubin, UA Negative Negative   Urobilinogen, Ur 0.2 0.2 - 1.0 mg/dL   Nitrite, UA Positive (A) Negative   Microscopic Examination See below:    Assessment & Plan:   1. Recurrent UTI Her UA appears grossly infected today, will start empiric Macrobid and  send for culture for further evaluation.  Will defer repeat UA to prove resolution of hematuria given benign hematuria work-up earlier this year.  She is in agreement with this plan. - Urinalysis, Complete - CULTURE, URINE COMPREHENSIVE - nitrofurantoin, macrocrystal-monohydrate, (MACROBID) 100 MG capsule; Take 1 capsule (100 mg total) by mouth 2 (two) times daily for 5 days.  Dispense: 10 capsule; Refill: 0   Return if symptoms worsen or fail to improve.  Debroah Loop, PA-C  [facility] [address], [place], [place] [address] [phone]

## 2022-05-06 LAB — CULTURE, URINE COMPREHENSIVE

## 2022-06-04 ENCOUNTER — Ambulatory Visit
Admission: RE | Admit: 2022-06-04 | Discharge: 2022-06-04 | Disposition: A | Discharge status: Home / Self Care | Payer: No Typology Code available for payment source | Source: Ambulatory Visit | Attending: Physician Assistant | Admitting: Physician Assistant

## 2022-06-04 DIAGNOSIS — Z1231 Encounter for screening mammogram for malignant neoplasm of breast: Secondary | ICD-10-CM | POA: Insufficient documentation

## 2023-04-01 ENCOUNTER — Other Ambulatory Visit: Payer: Self-pay | Admitting: Physician Assistant

## 2023-04-01 DIAGNOSIS — Z1231 Encounter for screening mammogram for malignant neoplasm of breast: Secondary | ICD-10-CM

## 2023-06-10 ENCOUNTER — Ambulatory Visit
Admission: RE | Admit: 2023-06-10 | Discharge: 2023-06-10 | Disposition: A | Discharge status: Home / Self Care | Payer: No Typology Code available for payment source | Source: Ambulatory Visit | Attending: Physician Assistant | Admitting: Physician Assistant

## 2023-06-10 DIAGNOSIS — Z1231 Encounter for screening mammogram for malignant neoplasm of breast: Secondary | ICD-10-CM | POA: Diagnosis present

## 2023-10-09 ENCOUNTER — Telehealth: Payer: Self-pay

## 2023-10-09 NOTE — Telephone Encounter (Signed)
 The patient called to schedule an appointment at our office. I informed her that we currently do not have any available appointments for new patients. I offered to add her to our call-back list and assured her that we will reach out as soon as appointments become available.  I also informed her that a referral is required to be seen at our office. The patient understood that she will remain on the call-back list until availability opens up. She asked how she would know if we received her referral, and noted that the referral instructions advise her to call our office. I clarified that she should disregard the part about calling, as we currently do not have openings.  She was understanding and stated that she will contact her provider to have the referral sent. She also requested to be scheduled with a female provider only.

## 2023-10-15 ENCOUNTER — Telehealth: Payer: Self-pay

## 2023-10-15 NOTE — Telephone Encounter (Signed)
 Pt has ref was placed on call back list

## 2023-10-28 ENCOUNTER — Telehealth: Payer: Self-pay

## 2023-10-28 NOTE — Telephone Encounter (Signed)
 Recev'd referral -scheduled 12-19-23 @ 10:40 am [facility] with Brian Campanile garrett

## 2023-11-09 IMAGING — MG MM DIGITAL SCREENING BILAT W/ TOMO AND CAD
6 of 10 series · 6 of 30 positions shown · non-contrast
Comparison: Previous exam(s).

CLINICAL DATA: Screening.

EXAM:
DIGITAL SCREENING BILATERAL MAMMOGRAM WITH TOMOSYNTHESIS AND CAD
TECHNIQUE: Bilateral screening digital craniocaudal and mediolateral oblique
mammograms were obtained. Bilateral screening digital breast
tomosynthesis was performed. The images were evaluated with
computer-aided detection.

[R MLO synth-2D (1 of 2)]
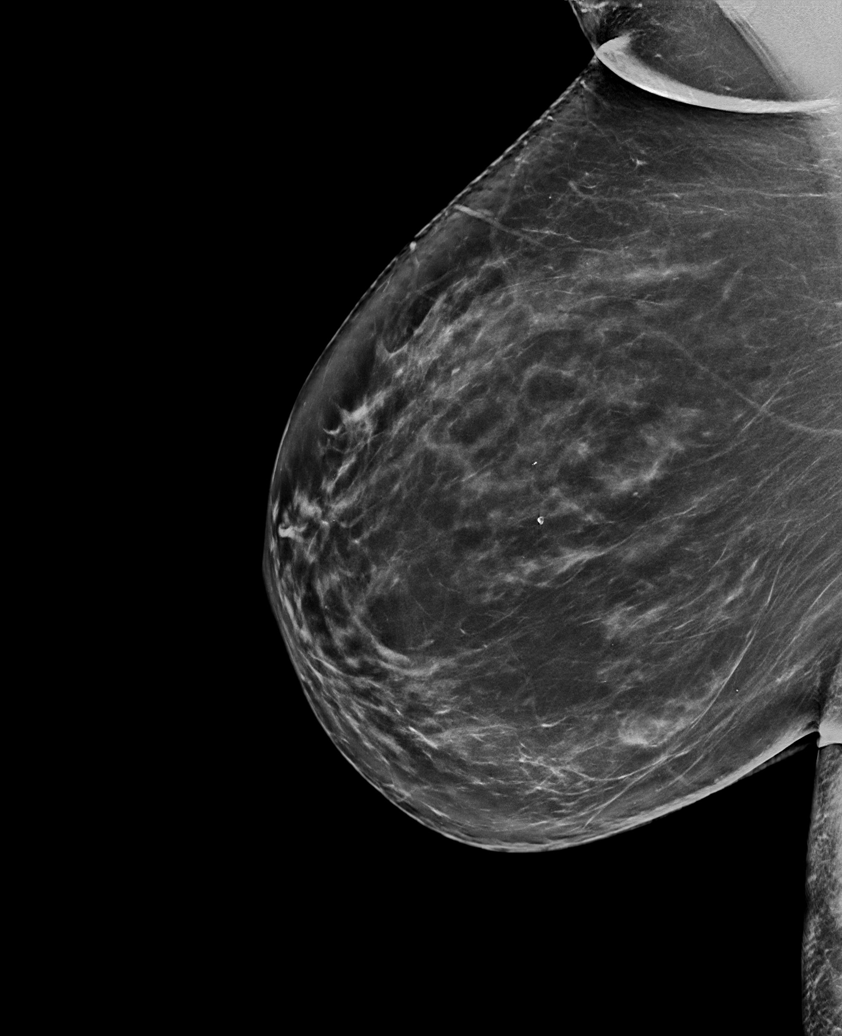

[R MLO synth-2D (2 of 2)]
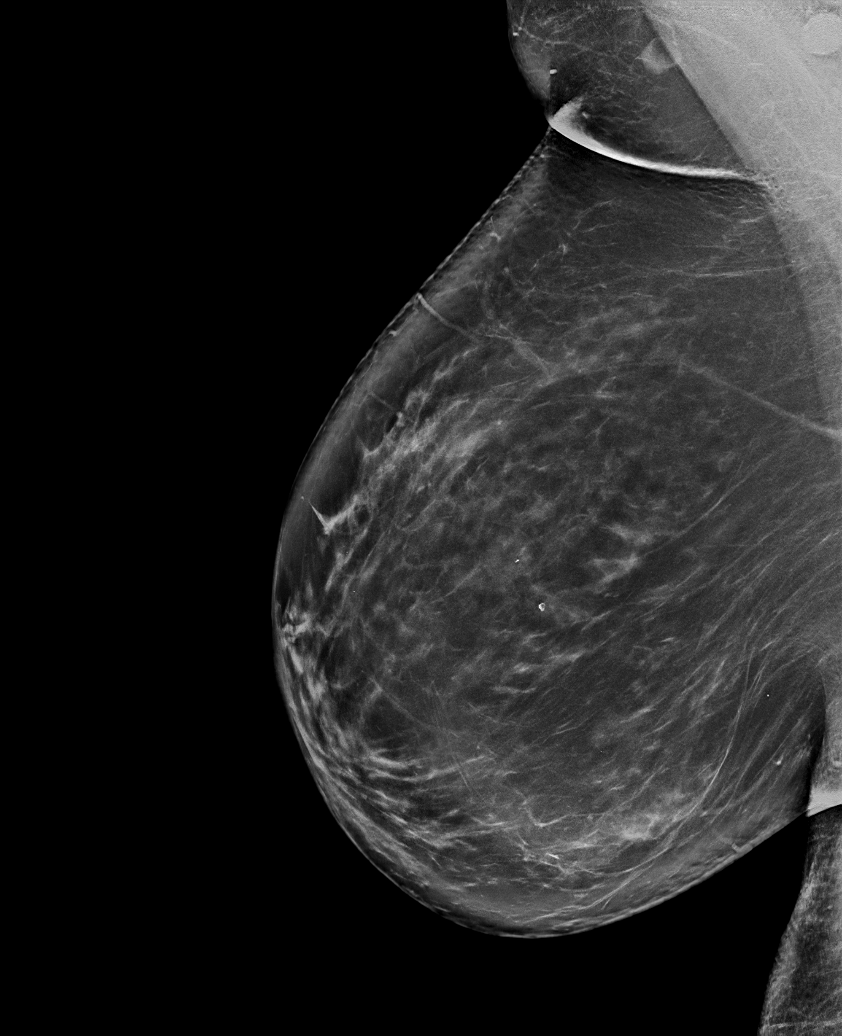

[R CC synth-2D]
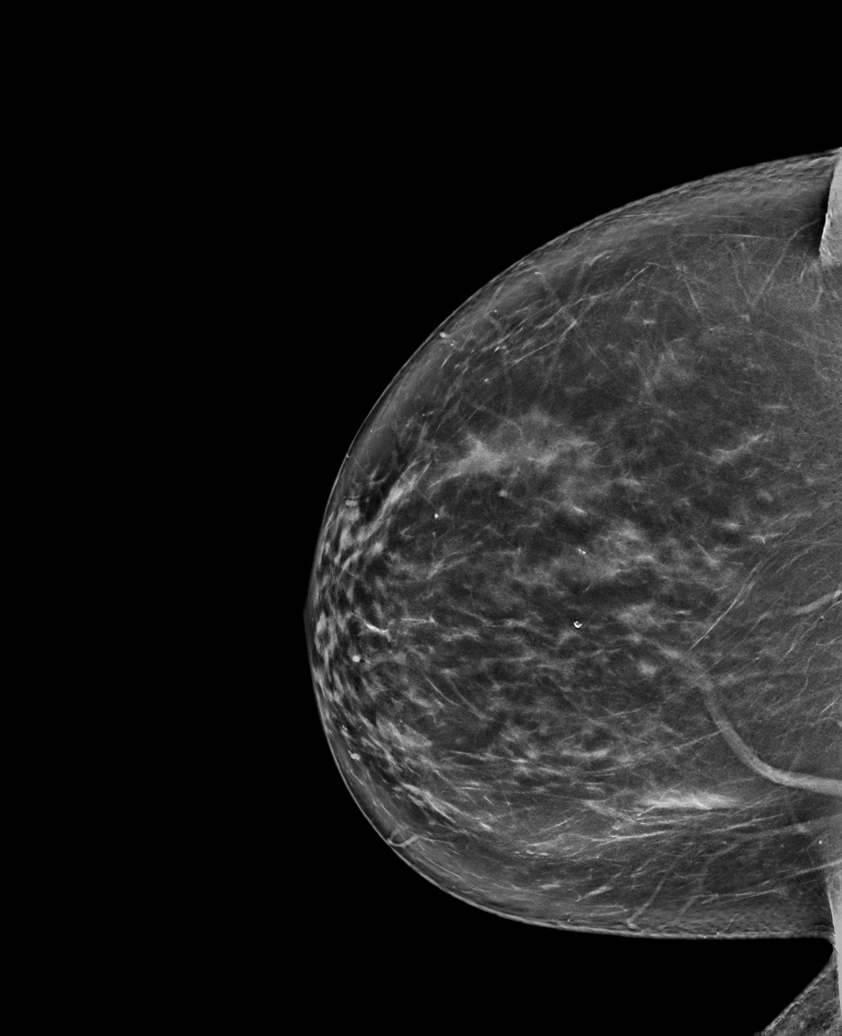

[L CC synth-2D]
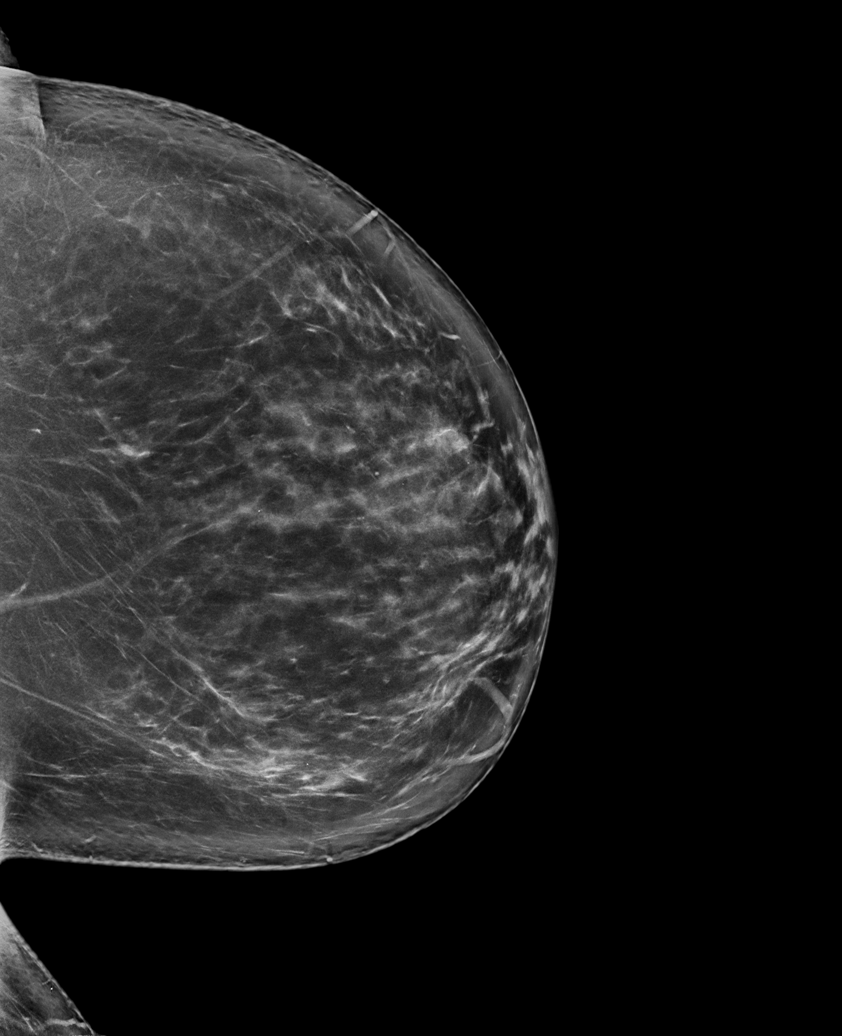

[L MLO synth-2D]
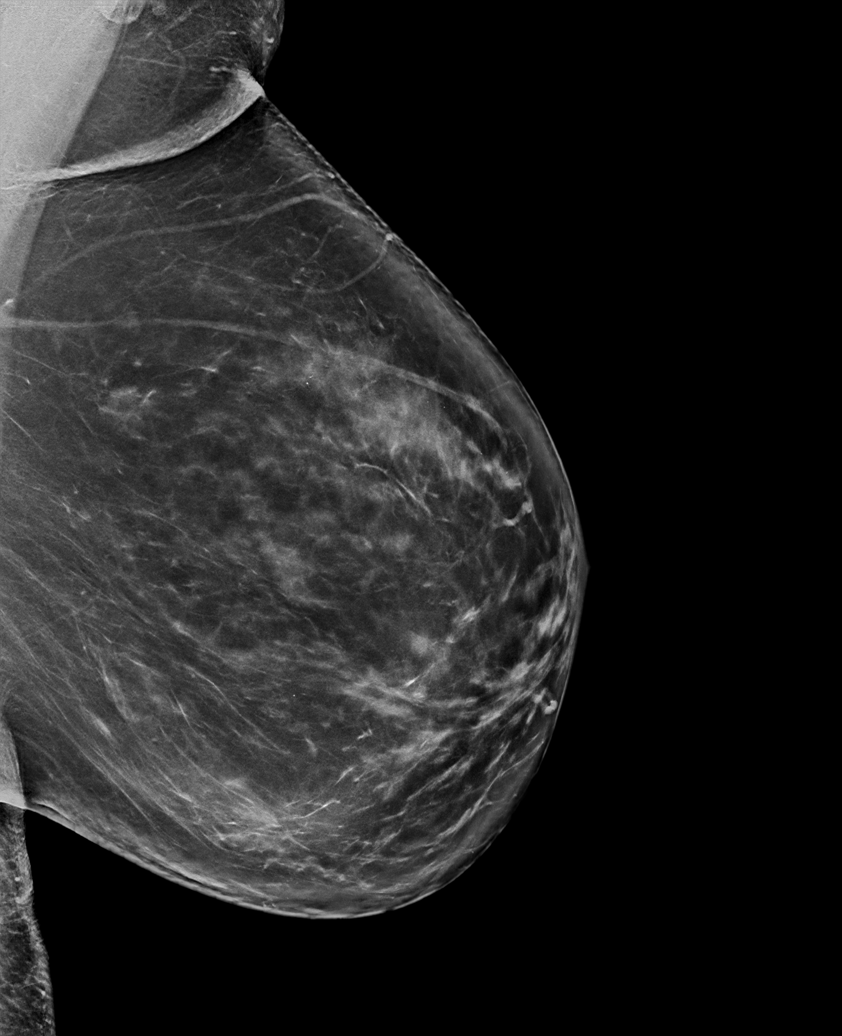

[R CC tomo · tomo slice 44/87.0]
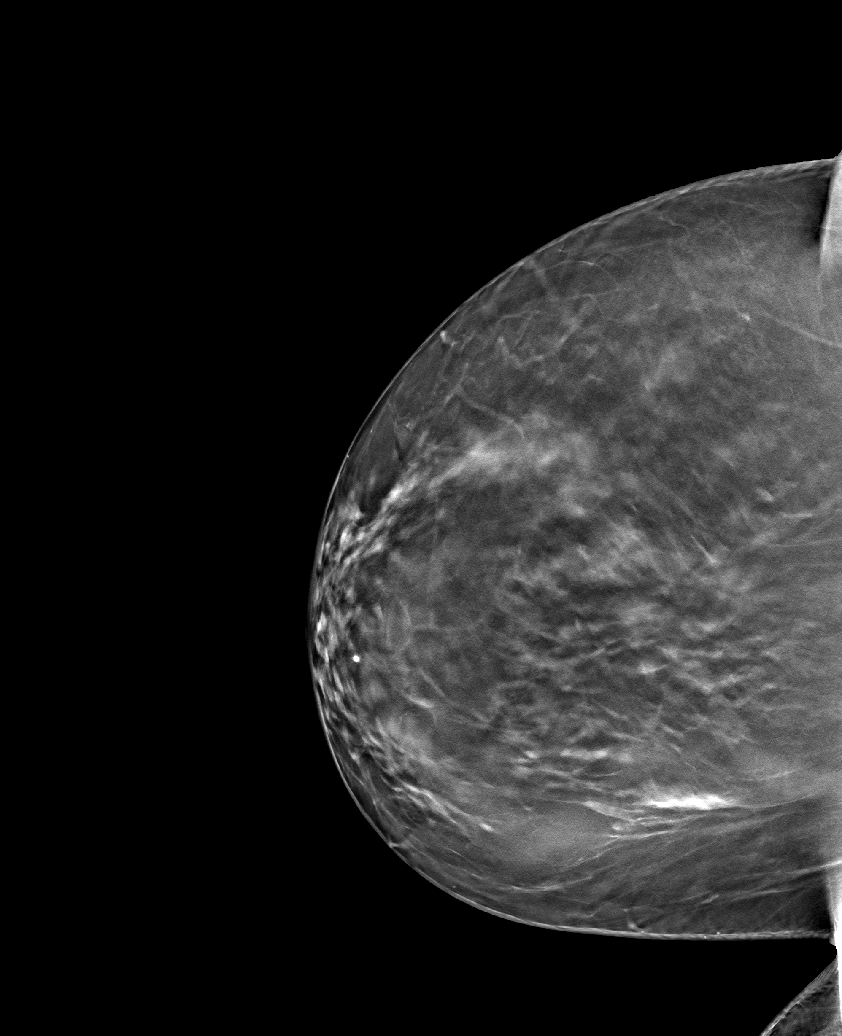

[6 of 30 positions shown; findings below may reference images not displayed]

ACR Breast Density Category c: The breast tissue is heterogeneously
dense, which may obscure small masses.
FINDINGS: There are no findings suspicious for malignancy.
IMPRESSION: No mammographic evidence of malignancy. A result letter of this
screening mammogram will be mailed directly to the patient.

RECOMMENDATION:
Screening mammogram in one year. (Code:Q3-W-BC3)

BI-RADS CATEGORY  1: Negative.

## 2023-12-18 NOTE — Progress Notes (Unsigned)
 Ellouise Console, PA-C [address] [place], [place]  [address] Phone: [phone]   Gastroenterology Consultation  Referring Provider:     Marikay Eva POUR, [place] Primary Care Physician:  Marikay Eva POUR, [place] Primary Gastroenterologist:  Ellouise Console, PA-C / Dr. Gordy Starch  Reason for Consultation:     Diarrhea        HPI:   Natasha Richardson is a 63 y.o. y/o female referred for consultation & management  by Marikay Eva POUR, PA.    Previous patient of [facility].  Referred by her PCP to evaluate diarrhea, Bloating, gas, lower abdominal cramping.  GI symptoms have been chronic, intermittent, and episodic for many years.  She has tried probiotic and papaya extract with mixed results.  Patient states she has had GI symptoms for many years.  She has episodes of diarrhea which occur every 1 or 2 months.  She may have diarrhea for a week and then has normal stools.  She has a rare episode of constipation when she travels.  She has intermittent lower abdominal cramping  bloating, and gas.  She denies hematochezia, melena, weight loss, or alarm symptoms.  Takes Imodium as needed which helps.  She is traveling to [place] in September and would like to get her GI symptoms better before then.  She has past history of adenomatous colon polyps, intermittent chronic diarrhea, bloating, GERD.  First colonoscopy age 77 reportedly normal in [place] .  Second colonoscopy age 49 with a benign polyp removed in [place] .  Last colonoscopy 09/2019 by Dr. Aundria at [facility] in [place]: Patient reports was normal.  Told to repeat colonoscopy in 10 years.  Labs 09/2023: Normal TSH, CBC, CMP.  Hgb 14.9.  No family history of colon cancer.  Past Medical History:  Diagnosis Date   Cancer ([facility])    skin   HPV in female     History reviewed. No pertinent surgical history.  Prior to Admission medications   Medication Sig Start Date End Date Taking? Authorizing Provider   naproxen (NAPROSYN) 500 MG tablet Take 500 mg by mouth 2 (two) times daily with a meal. PRN    [provider]    Family History  Problem Relation Age of Onset   Parkinson's disease Mother    Leukemia Father    Breast cancer Paternal Aunt 63     Social History   Tobacco Use   Smoking status: Never   Smokeless tobacco: Never  Vaping Use   Vaping status: Never Used  Substance Use Topics   Alcohol use: Yes   Drug use: Not Currently    Allergies as of 12/19/2023 - Review Complete 12/19/2023  Allergen Reaction Noted   Moxifloxacin Anaphylaxis 03/10/2018   Sulfa antibiotics Rash 01/04/2016    Review of Systems:    All systems reviewed and negative except where noted in HPI.   Physical Exam:  BP 102/68   Pulse 62   Ht 5' 4 (1.626 m)   Wt 197 lb (89.4 kg)   LMP 12/30/2015   SpO2 95%   BMI 33.81 kg/m  Patient's last menstrual period was 12/30/2015.  General:   Alert,  Well-developed, well-nourished, pleasant and cooperative in NAD Lungs:  Respirations even and unlabored.  Clear throughout to auscultation.   No wheezes, crackles, or rhonchi. No acute distress. Heart:  Regular rate and rhythm; no murmurs, clicks, rubs, or gallops. Abdomen:  Normal bowel sounds.  No bruits.  Soft, and obese without  masses, hepatosplenomegaly or hernias noted.  Mild diffuse bilateral lower abdominal tenderness.  No upper abdominal tenderness.  No guarding or rebound tenderness.    Neurologic:  Alert and oriented x3;  grossly normal neurologically. Psych:  Alert and cooperative. Normal mood and affect.  Imaging Studies: No results found.  Assessment and Plan:   Natasha Richardson is a 63 y.o. 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  She has no alarm symptoms.  I am giving trial of treatment with follow-up.  Colonoscopy is up-to-date.  Requesting  previous GI records.  Plan:  - Celiac Lab - Stool Test: Fecal Calprotectin; Fecal Pancreatic Elastase - Rx Dicyclomine 10mg  1 tablet 3 times daily as needed #90, 3 refills. - Start OTC Benefiber 1 - 2 TBSP in a drink daily. - Low FODMAP Diet given  Follow up 6 weeks.  Ellouise Console, PA-C

## 2023-12-19 ENCOUNTER — Other Ambulatory Visit

## 2023-12-19 ENCOUNTER — Ambulatory Visit: Admitting: Physician Assistant

## 2023-12-19 ENCOUNTER — Encounter: Payer: Self-pay | Admitting: Physician Assistant

## 2023-12-19 VITALS — BP 102/68 | HR 62 | Ht 64.0 in | Wt 197.0 lb

## 2023-12-19 DIAGNOSIS — K580 Irritable bowel syndrome with diarrhea: Secondary | ICD-10-CM

## 2023-12-19 DIAGNOSIS — R197 Diarrhea, unspecified: Secondary | ICD-10-CM

## 2023-12-19 DIAGNOSIS — R109 Unspecified abdominal pain: Secondary | ICD-10-CM

## 2023-12-19 MED ORDER — DICYCLOMINE HCL 10 MG PO CAPS: 10.0000 mg | ORAL_CAPSULE | Freq: Three times a day (TID) | ORAL | 2 refills | Status: AC

## 2023-12-19 NOTE — Patient Instructions (Addendum)
   Start OTC Benefiber Powder. Mix 1 - 2 Tablespoons in 6 - 8 ounces of a Drink Once Daily. Drink 64 ounces of water / fluids Daily.   Your provider has requested that you go to the basement level for lab work before leaving today. Press B on the elevator. The lab is located at the first door on the left as you exit the elevator.  Use Miralax 1 capful in 8 ounces of liquid when having constipation.  Please follow up sooner if symptoms increase or worsen  Due to recent changes in healthcare laws, you may see the results of your imaging and laboratory studies on MyChart before your provider has had a chance to review them.  We understand that in some cases there may be results that are confusing or concerning to you. Not all laboratory results come back in the same time frame and the provider may be waiting for multiple results in order to interpret others.  Please give us  48 hours in order for your provider to thoroughly review all the results before contacting the office for clarification of your results.   Thank you for trusting me with your gastrointestinal care!   Ellouise Console, PA-C _______________________________________________________  If your blood pressure at your visit was 140/90 or greater, please contact your primary care physician to follow up on this.  _______________________________________________________  If you are age 36 or older, your body mass index should be between 23-30. Your Body mass index is 33.81 kg/m. If this is out of the aforementioned range listed, please consider follow up with your Primary Care Provider.  If you are age 59 or younger, your body mass index should be between 19-25. Your Body mass index is 33.81 kg/m. If this is out of the aformentioned range listed, please consider follow up with your Primary Care Provider.   ________________________________________________________  The [facility] providers would like to encourage you to use MYCHART to  communicate with providers for non-urgent requests or questions.  Due to long hold times on the telephone, sending your provider a message by [facility] may be a faster and more efficient way to get a response.  Please allow 48 business hours for a response.  Please remember that this is for non-urgent requests.  _______________________________________________________

## 2023-12-20 ENCOUNTER — Other Ambulatory Visit

## 2023-12-20 DIAGNOSIS — R197 Diarrhea, unspecified: Secondary | ICD-10-CM

## 2023-12-22 ENCOUNTER — Ambulatory Visit: Payer: Self-pay | Admitting: Physician Assistant

## 2023-12-22 LAB — CELIAC DISEASE AB SCREEN W/RFX
Antigliadin Abs, IgA: 5 U (ref 0–19)
IgA/Immunoglobulin A, Serum: 469 mg/dL — ABNORMAL HIGH (ref 87–352)
Transglutaminase IgA: <2 U/mL (ref 0–3)

## 2023-12-24 LAB — CALPROTECTIN, FECAL: Calprotectin, Fecal: 9 ug/g (ref 0–120)

## 2023-12-26 LAB — PANCREATIC ELASTASE, FECAL: Pancreatic Elastase-1, Stool: >800 ug/g (ref >200)

## 2023-12-31 NOTE — Progress Notes (Signed)
 Addendum: Reviewed and agree with assessment and management plan. Asha Grumbine, Carie Caddy, MD

## 2024-03-03 ENCOUNTER — Other Ambulatory Visit: Payer: Self-pay | Admitting: Physician Assistant

## 2024-03-03 DIAGNOSIS — Z1231 Encounter for screening mammogram for malignant neoplasm of breast: Secondary | ICD-10-CM

## 2024-06-10 ENCOUNTER — Encounter

## 2024-06-15 ENCOUNTER — Ambulatory Visit
Admission: RE | Admit: 2024-06-15 | Discharge: 2024-06-15 | Disposition: A | Discharge status: Home / Self Care | Source: Ambulatory Visit | Attending: Physician Assistant | Admitting: Physician Assistant

## 2024-06-15 DIAGNOSIS — Z1231 Encounter for screening mammogram for malignant neoplasm of breast: Secondary | ICD-10-CM | POA: Insufficient documentation
